# Patient Record
Sex: Male | Born: 1960 | Race: White | Hispanic: No | Marital: Married
Health system: Southern US, Community
[De-identification: ages and names within clinical notes are randomized; demographics above are authoritative.]

## PROBLEM LIST (undated history)

## (undated) DIAGNOSIS — R569 Unspecified convulsions: Secondary | ICD-10-CM

---

## 2017-09-02 ENCOUNTER — Emergency Department
Admission: EM | Admit: 2017-09-02 | Discharge: 2017-09-02 | Disposition: A | Discharge status: Home / Self Care | Payer: BLUE CROSS/BLUE SHIELD | Attending: Emergency Medicine | Admitting: Emergency Medicine

## 2017-09-02 ENCOUNTER — Emergency Department: Payer: BLUE CROSS/BLUE SHIELD

## 2017-09-02 ENCOUNTER — Encounter: Payer: Self-pay | Admitting: Emergency Medicine

## 2017-09-02 DIAGNOSIS — Z79899 Other long term (current) drug therapy: Secondary | ICD-10-CM | POA: Insufficient documentation

## 2017-09-02 DIAGNOSIS — R569 Unspecified convulsions: Secondary | ICD-10-CM | POA: Insufficient documentation

## 2017-09-02 HISTORY — DX: Unspecified convulsions: R56.9

## 2017-09-02 LAB — COMPREHENSIVE METABOLIC PANEL
ALBUMIN: 4.2 g/dL (ref 3.5–5.0)
ALT: 16 U/L — ABNORMAL LOW (ref 17–63)
ANION GAP: 17 — AB (ref 5–15)
AST: 35 U/L (ref 15–41)
Alkaline Phosphatase: 65 U/L (ref 38–126)
BUN: 14 mg/dL (ref 6–20)
CHLORIDE: 103 mmol/L (ref 101–111)
CO2: 18 mmol/L — ABNORMAL LOW (ref 22–32)
Calcium: 9 mg/dL (ref 8.9–10.3)
Creatinine, Ser: 0.9 mg/dL (ref 0.61–1.24)
GFR calc Af Amer: >60 mL/min (ref >60)
GFR calc non Af Amer: >60 mL/min (ref >60)
GLUCOSE: 169 mg/dL — AB (ref 65–99)
POTASSIUM: 3.6 mmol/L (ref 3.5–5.1)
SODIUM: 138 mmol/L (ref 135–145)
Total Bilirubin: 0.4 mg/dL (ref 0.3–1.2)
Total Protein: 7.6 g/dL (ref 6.5–8.1)

## 2017-09-02 LAB — CBC WITH DIFFERENTIAL/PLATELET
Basophils Absolute: 0 10*3/uL (ref 0–0.1)
Basophils Relative: 0 %
EOS PCT: 0 %
Eosinophils Absolute: 0 10*3/uL (ref 0–0.7)
HEMATOCRIT: 45.5 % (ref 40.0–52.0)
Hemoglobin: 15.5 g/dL (ref 13.0–18.0)
LYMPHS ABS: 3.3 10*3/uL (ref 1.0–3.6)
LYMPHS PCT: 44 %
MCH: 32.4 pg (ref 26.0–34.0)
MCHC: 34.1 g/dL (ref 32.0–36.0)
MCV: 95.2 fL (ref 80.0–100.0)
MONO ABS: 0.5 10*3/uL (ref 0.2–1.0)
Monocytes Relative: 6 %
NEUTROS ABS: 3.7 10*3/uL (ref 1.4–6.5)
Neutrophils Relative %: 50 %
PLATELETS: 164 10*3/uL (ref 150–440)
RBC: 4.78 MIL/uL (ref 4.40–5.90)
RDW: 13.6 % (ref 11.5–14.5)
WBC: 7.6 10*3/uL (ref 3.8–10.6)

## 2017-09-02 LAB — PHENYTOIN LEVEL, TOTAL: Phenytoin Lvl: 12.6 ug/mL (ref 10.0–20.0)

## 2017-09-02 LAB — VALPROIC ACID LEVEL: Valproic Acid Lvl: 21 ug/mL — ABNORMAL LOW (ref 50.0–100.0)

## 2017-09-02 MED ORDER — DIVALPROEX SODIUM 500 MG PO DR TAB
1000.0000 mg | DELAYED_RELEASE_TABLET | Freq: Once | ORAL | Status: AC
  Administered 2017-09-02: 1000 mg via ORAL
  Filled 2017-09-02: qty 2

## 2017-09-02 MED ORDER — LORAZEPAM 2 MG/ML IJ SOLN
INTRAMUSCULAR | Status: AC
  Administered 2017-09-02: 2 mg via INTRAVENOUS
  Filled 2017-09-02: qty 1

## 2017-09-02 MED ORDER — LORAZEPAM 2 MG/ML IJ SOLN
2.0000 mg | Freq: Once | INTRAMUSCULAR | Status: AC
  Administered 2017-09-02: 2 mg via INTRAVENOUS

## 2017-09-02 NOTE — ED Triage Notes (Signed)
Patient arrives with EMS. States has history of seizures. Has missed one dose. Has aura typical of history. During discussion patient haas tonic clonic seizure followed by postictal period.
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
## 2017-09-02 NOTE — ED Notes (Signed)
Now able to wake patient. Knows wife, understands had seizure. Remains drowsy.

## 2017-09-02 NOTE — ED Notes (Signed)
Asleep on side, resp unlabored, wife at side, states is normal post seizure behavior.

## 2017-09-02 NOTE — ED Notes (Signed)
Alert and oriented now, able to to take po meds and juice without difficulty.

## 2017-09-02 NOTE — Discharge Instructions (Signed)
Increase the Depakote to 500 mg  twice a day. Return for any further problems. Follow-up with your neurologist

## 2018-01-28 IMAGING — CT CT HEAD W/O CM
3 of 4 series · 15 of 47 positions shown, 18 images · non-contrast
Comparison: None.

CLINICAL DATA: Subacute neuro deficits.  History seizure

EXAM:
CT HEAD WITHOUT CONTRAST
TECHNIQUE: Contiguous axial images were obtained from the base of the skull
through the vertex without intravenous contrast.

[Series 3: head wo · axial · 0.45mm/px · z∈[+250,+370]mm · 9 of 28 slices shown, 12 images]
[im 2/28  brain]
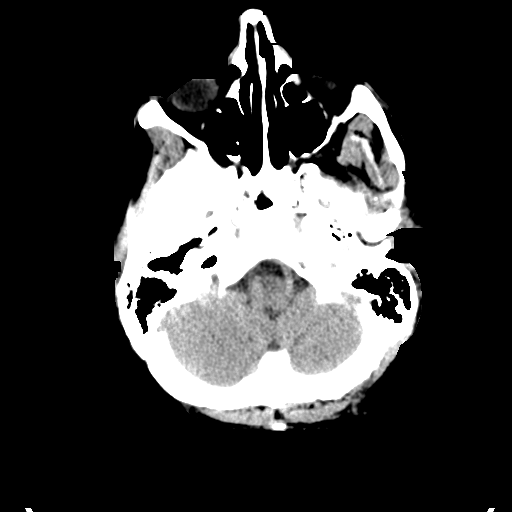
[im 2/28  bone]
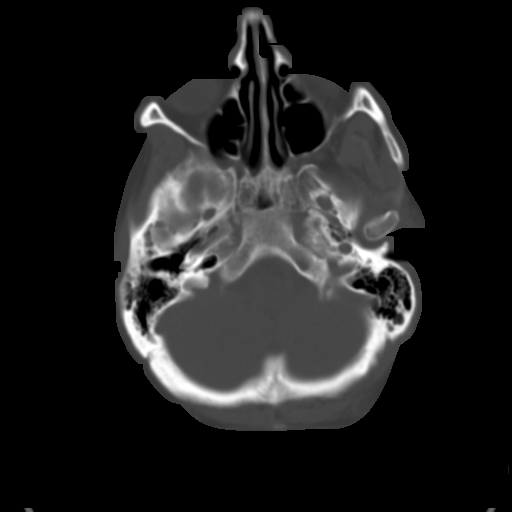
[im 6/28  brain]
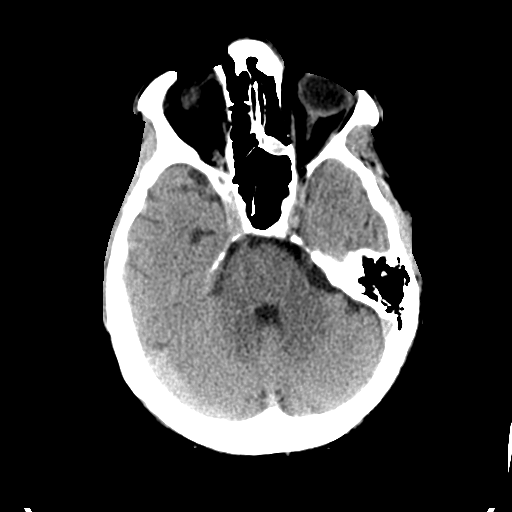
[im 8/28  brain]
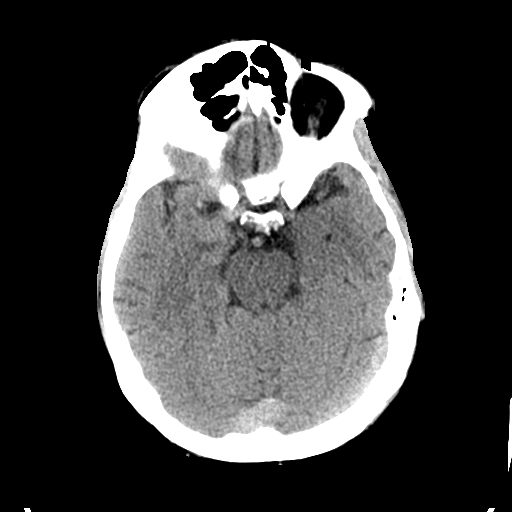
[im 12/28  brain]
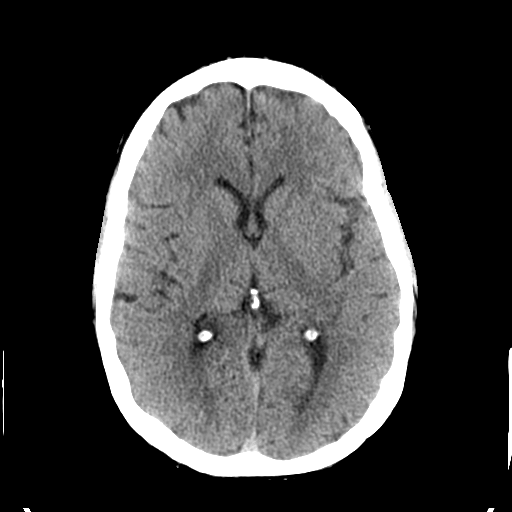
[im 14/28  brain]
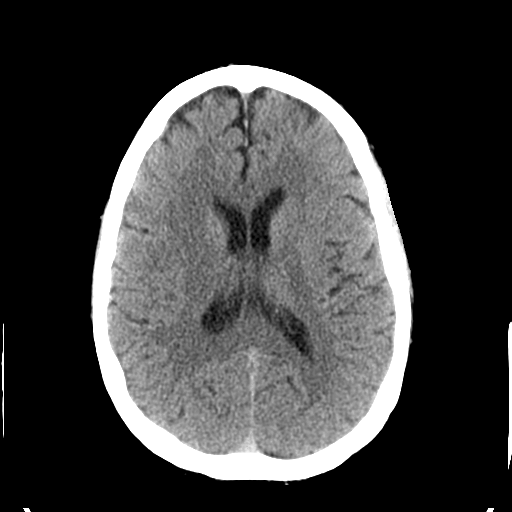
[im 14/28  bone]
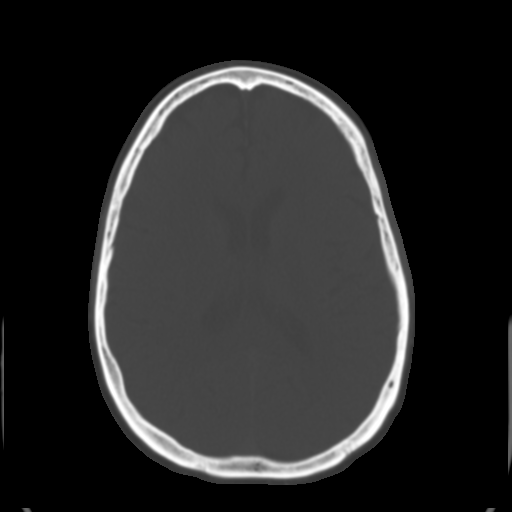
[im 16/28  brain]
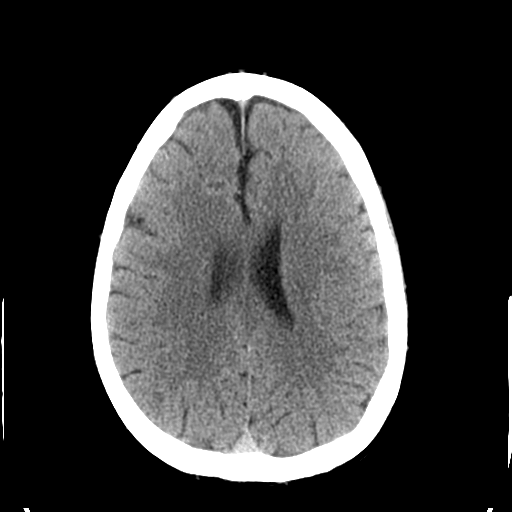
[im 20/28  brain]
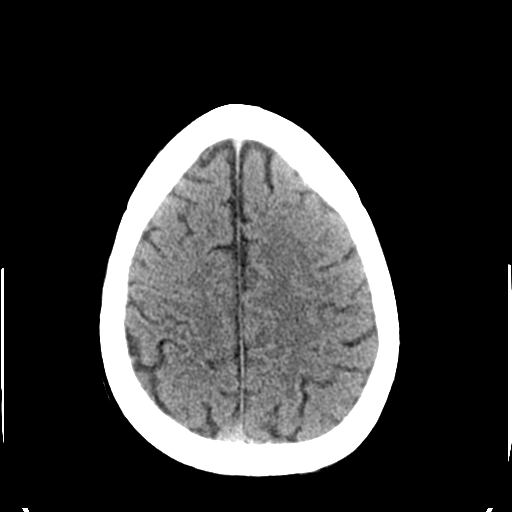
[im 22/28  brain]
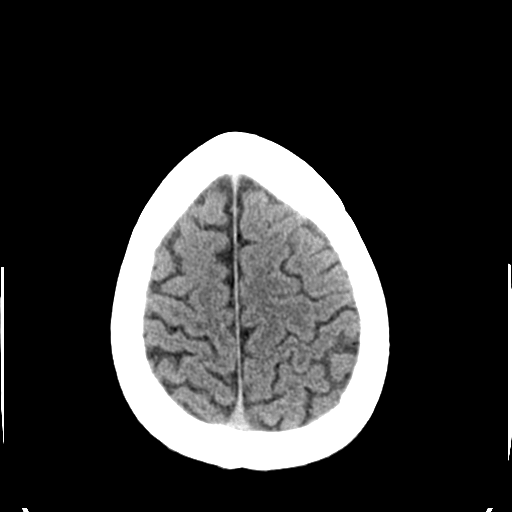
[im 26/28  brain]
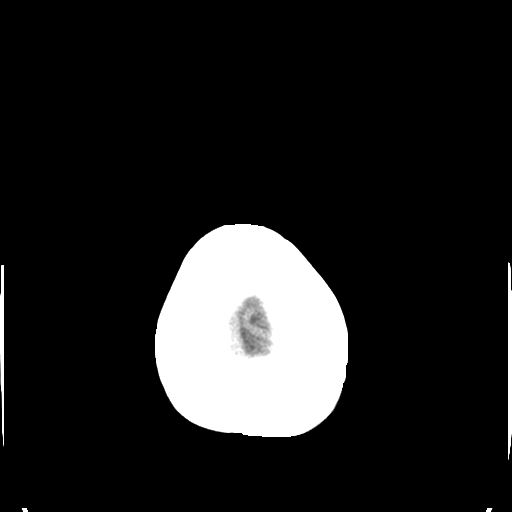
[im 26/28  bone]
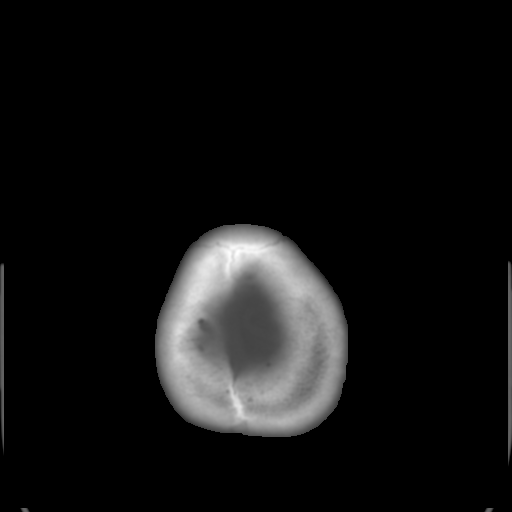

[Series 6: coronal soft tissue · coronal · 0.31mm/px · 3 of 64 slices shown]
[im 22/64  brain]
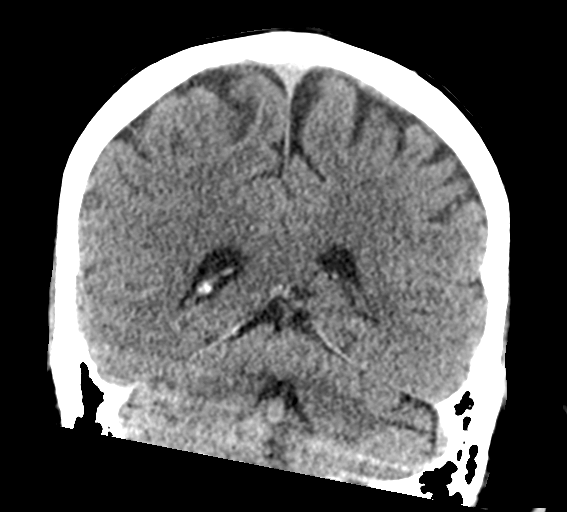
[im 29/64  brain]
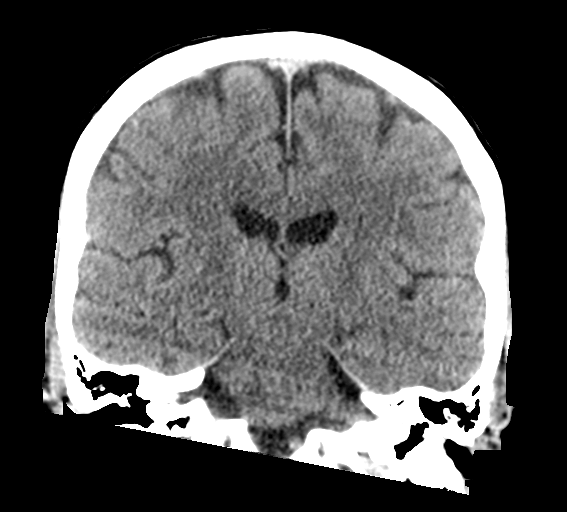
[im 36/64  brain]
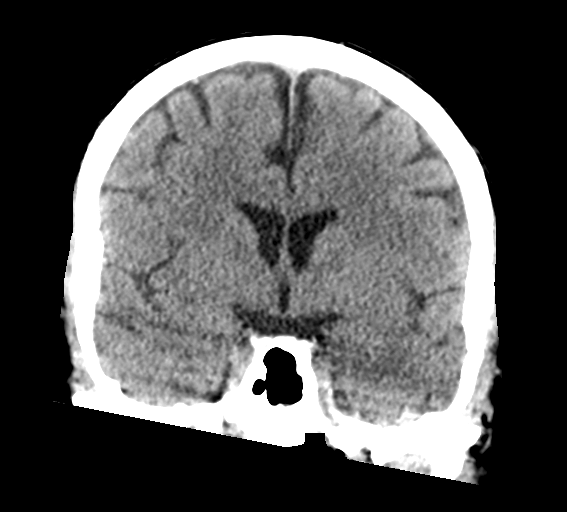

[Series 7: sagittal soft tissue · sagittal · 0.30mm/px · 3 of 50 slices shown]
[im 20/50  brain]
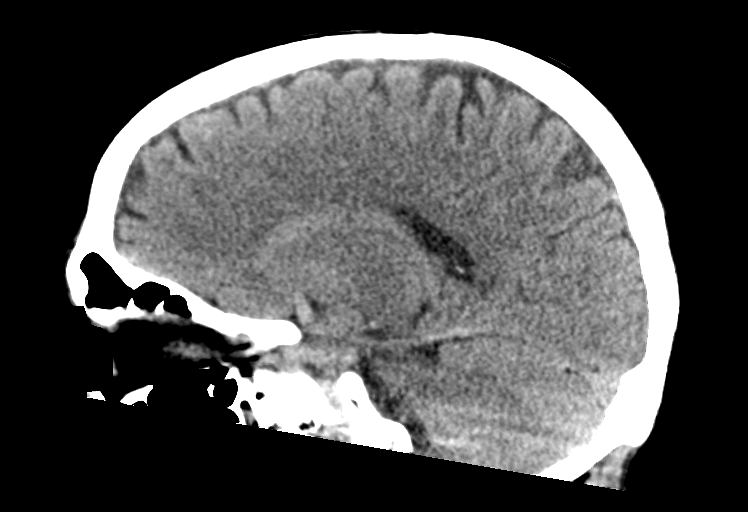
[im 25/50  brain]
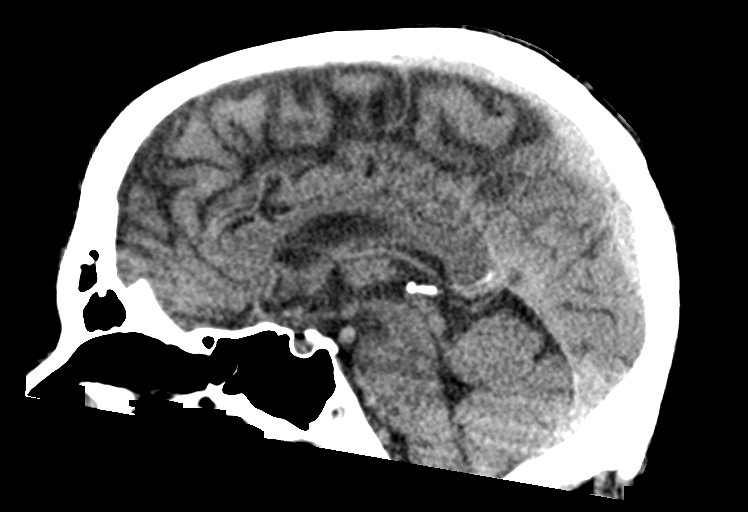
[im 31/50  brain]
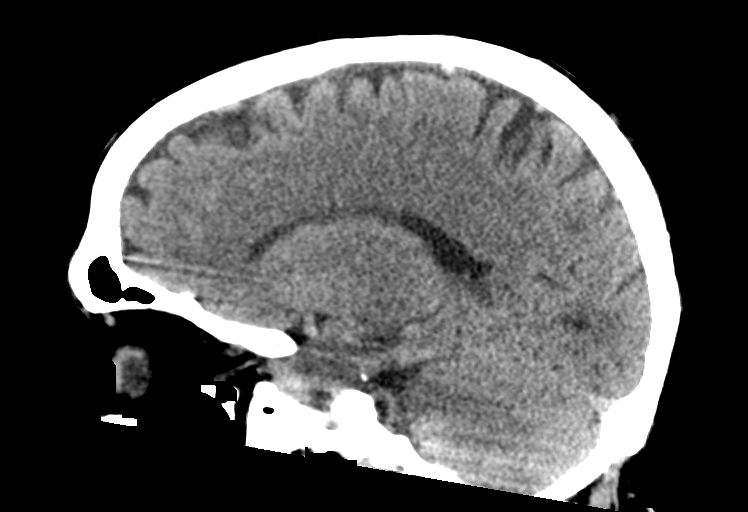

[15 of 47 positions shown; findings below may reference images not displayed]

FINDINGS: Brain: No evidence of acute infarction, hemorrhage, hydrocephalus,
extra-axial collection or mass lesion/mass effect.

Vascular: Negative for hyperdense vessel

Skull: Negative

Sinuses/Orbits: Mild mucosal edema left ethmoid sinus. Negative
orbit

Other: None
IMPRESSION: Negative CT head
